# Patient Record
Sex: Male | Born: 1990 | Race: Black or African American | Hispanic: No | Marital: Married | Smoking: Current every day smoker
Health system: Southern US, Community
[De-identification: ages and names within clinical notes are randomized; demographics above are authoritative.]

---

## 2016-04-05 ENCOUNTER — Emergency Department (HOSPITAL_COMMUNITY): Payer: Worker's Compensation

## 2016-04-05 ENCOUNTER — Emergency Department (HOSPITAL_COMMUNITY)
Admission: EM | Admit: 2016-04-05 | Discharge: 2016-04-05 | Disposition: A | Discharge status: Home / Self Care | Payer: Worker's Compensation | Attending: Emergency Medicine | Admitting: Emergency Medicine

## 2016-04-05 ENCOUNTER — Encounter (HOSPITAL_COMMUNITY): Payer: Self-pay | Admitting: Emergency Medicine

## 2016-04-05 DIAGNOSIS — W11XXXA Fall on and from ladder, initial encounter: Secondary | ICD-10-CM | POA: Diagnosis not present

## 2016-04-05 DIAGNOSIS — W19XXXA Unspecified fall, initial encounter: Secondary | ICD-10-CM

## 2016-04-05 DIAGNOSIS — Y9389 Activity, other specified: Secondary | ICD-10-CM | POA: Diagnosis not present

## 2016-04-05 DIAGNOSIS — S92001B Unspecified fracture of right calcaneus, initial encounter for open fracture: Secondary | ICD-10-CM | POA: Diagnosis not present

## 2016-04-05 DIAGNOSIS — F17210 Nicotine dependence, cigarettes, uncomplicated: Secondary | ICD-10-CM | POA: Insufficient documentation

## 2016-04-05 DIAGNOSIS — Y929 Unspecified place or not applicable: Secondary | ICD-10-CM | POA: Insufficient documentation

## 2016-04-05 DIAGNOSIS — Y990 Civilian activity done for income or pay: Secondary | ICD-10-CM | POA: Diagnosis not present

## 2016-04-05 DIAGNOSIS — S92002B Unspecified fracture of left calcaneus, initial encounter for open fracture: Secondary | ICD-10-CM | POA: Insufficient documentation

## 2016-04-05 DIAGNOSIS — S92009B Unspecified fracture of unspecified calcaneus, initial encounter for open fracture: Secondary | ICD-10-CM

## 2016-04-05 DIAGNOSIS — S99921A Unspecified injury of right foot, initial encounter: Secondary | ICD-10-CM | POA: Diagnosis present

## 2016-04-05 MED ORDER — HYDROMORPHONE HCL 1 MG/ML IJ SOLN
0.5000 mg | Freq: Once | INTRAMUSCULAR | Status: AC
  Administered 2016-04-05: 0.5 mg via INTRAMUSCULAR
  Filled 2016-04-05: qty 1

## 2016-04-05 MED ORDER — HYDROMORPHONE HCL 1 MG/ML IJ SOLN
1.0000 mg | Freq: Once | INTRAMUSCULAR | Status: AC
  Administered 2016-04-05: 1 mg via INTRAVENOUS
  Filled 2016-04-05: qty 1

## 2016-04-05 MED ORDER — OXYCODONE-ACETAMINOPHEN 5-325 MG PO TABS: 1.0000 | ORAL_TABLET | Freq: Four times a day (QID) | ORAL | 0 refills | Status: DC | PRN

## 2016-04-05 MED ORDER — KETOROLAC TROMETHAMINE 30 MG/ML IJ SOLN
30.0000 mg | Freq: Once | INTRAMUSCULAR | Status: AC
  Administered 2016-04-05: 30 mg via INTRAMUSCULAR
  Filled 2016-04-05: qty 1

## 2016-04-05 NOTE — ED Triage Notes (Addendum)
Pt was at work and fell 20 feet from ladder and landed on bilateral heels, unable to walk at this time. Pt reports bottom of ladder slid on the asphalt causing him to fall.   Denies head trauma or LOC.  Pt alert and oriented at this time. Denies head/neck/back pain. Pt has pedal pulses bilaterally with appropriate sensation.  No deformity noted.

## 2016-04-05 NOTE — Discharge Instructions (Signed)
Use the pain medicine as needed for pain. Weight-bear as tolerated. The splints can come off for bathing. Follow-up with Dr. Romeo AppleHarrison.

## 2016-04-05 NOTE — ED Notes (Signed)
Patient requesting urine drug screen for his records just in case he needs it.  MD made aware and states that he has no reason to order it and it would now be positive since he has received pain meds in the ER.

## 2016-04-05 NOTE — ED Notes (Signed)
ED Provider at bedside. 

## 2016-04-05 NOTE — ED Provider Notes (Signed)
[facility]-EMERGENCY DEPT Provider Note   CSN: [id number] Arrival date & time: 04/05/16  1704     History   Chief Complaint Chief Complaint  Patient presents with  . Fall    HPI Travis Luna is a 26 y.o. male.  HPI Patient was working on a ladder about 20 feet up when he states the latter fell down and landed on both of his heels. States he cannot walk because of pain on the back of his foot. No other injury or pain to other parts of his body. No numbness or weakness. No headache. No numbness in his feet. Severe pain posteriorly on his feet.   History reviewed. No pertinent past medical history.  There are no active problems to display for this patient.   History reviewed. No pertinent surgical history.     Home Medications    Prior to Admission medications   Medication Sig Start Date End Date Taking? Authorizing Provider  oxyCODONE-acetaminophen (PERCOCET/ROXICET) 5-325 MG tablet Take 1-2 tablets by mouth every 6 (six) hours as needed for severe pain. 04/05/16   Benjiman CoreNathan Nimah Uphoff, MD    Family History History reviewed. No pertinent family history.  Social History Social History  Substance Use Topics  . Smoking status: Current Every Day Smoker    Packs/day: 0.50    Types: Cigarettes  . Smokeless tobacco: Not on file  . Alcohol use No     Allergies   Patient has no known allergies.   Review of Systems Review of Systems  Constitutional: Negative for appetite change.  HENT: Negative for dental problem.   Respiratory: Negative for shortness of breath.   Cardiovascular: Negative for chest pain.  Gastrointestinal: Negative for abdominal pain.  Genitourinary: Negative for flank pain.  Musculoskeletal: Positive for back pain and gait problem. Negative for neck pain and neck stiffness.       Bilateral heel pain.  Neurological: Negative for headaches.  Hematological: Negative for adenopathy.  Psychiatric/Behavioral: Negative for confusion.     Physical  Exam Updated Vital Signs BP 111/58 (BP Location: Left Arm)   Pulse 90   Temp 98.7 F (37.1 C) (Oral)   Resp 16   Ht 5\' 6"  (1.676 m)   Wt 140 lb (63.5 kg)   SpO2 99%   BMI 22.60 kg/m   Physical Exam  Constitutional: He appears well-developed.  HENT:  Head: Atraumatic.  Cardiovascular: Normal rate.   Pulmonary/Chest: Effort normal.  Abdominal: Soft.  Musculoskeletal: He exhibits tenderness.  Tenderness to bilateral heels. No tenderness over mid or anterior foot on either side. Mild tenderness over the very distal Achilles bilaterally but states the pain is more in his heel on the Achilles. Sensation intact grossly over both feet. No tenderness of her knees hips lower spine or other extremities.     ED Treatments / Results  Labs (all labs ordered are listed, but only abnormal results are displayed) Labs Reviewed - No data to display  EKG  EKG Interpretation None       Radiology Dg Os Calcis Left  Result Date: 04/05/2016 CLINICAL DATA:  Status post 20 foot fall from a ladder today. Bilateral heel pain. Initial encounter. EXAM: LEFT OS CALCIS - 2+ VIEW COMPARISON:  None. FINDINGS: A nondisplaced and incomplete fracture is seen through the posterior calcaneus. The subtalar joint and calcaneocuboid joint both appear normal. The examination is otherwise normal. IMPRESSION: Nondisplaced fracture through posterior calcaneus. Electronically Signed   By: Drusilla Kannerhomas  Dalessio M.D.   On: 04/05/2016 19:06  Dg Os Calcis Right  Result Date: 04/05/2016 CLINICAL DATA:  Status post 20 foot fall from a ladder today. Bilateral heel pain. Initial encounter. EXAM: RIGHT OS CALCIS - 2+ VIEW COMPARISON:  None. FINDINGS: Nondisplaced fracture the posterior aspect of the calcaneus is identified. The calcaneocuboid and subtalar joints appear normal. IMPRESSION: Nondisplaced fracture posterior calcaneus. Electronically Signed   By: Drusilla Kanner M.D.   On: 04/05/2016 19:08    Procedures Procedures  (including critical care time)  Medications Ordered in ED Medications  HYDROmorphone (DILAUDID) injection 0.5 mg (0.5 mg Intramuscular Given 04/05/16 1831)  HYDROmorphone (DILAUDID) injection 1 mg (1 mg Intravenous Given 04/05/16 2017)  ketorolac (TORADOL) 30 MG/ML injection 30 mg (30 mg Intramuscular Given 04/05/16 2017)     Initial Impression / Assessment and Plan / ED Course  I have reviewed the triage vital signs and the nursing notes.  Pertinent labs & imaging results that were available during my care of the patient were reviewed by me and considered in my medical decision making (see chart for details).  Clinical Course     Patient had a fall from a ladder about 20 feet up. Bilateral calcaneal fractures. Reviewed images with Dr. Romeo Apple with whom I discussed on the phone. Weight-bear as tolerated and bilateral cam walkers. Walker for home. Will get pain medicines. Will follow-up in the office. No sign of other injury, specifically no low back pain.  Final Clinical Impressions(s) / ED Diagnoses   Final diagnoses:  Fall, initial encounter  Open nondisplaced fracture of calcaneus, unspecified laterality, unspecified portion of calcaneus, initial encounter    New Prescriptions New Prescriptions   OXYCODONE-ACETAMINOPHEN (PERCOCET/ROXICET) 5-325 MG TABLET    Take 1-2 tablets by mouth every 6 (six) hours as needed for severe pain.     Benjiman Core, MD 04/05/16 [phone]

## 2016-04-05 NOTE — Progress Notes (Signed)
Patient ID: Travis Luna, male   DOB: Jun 30, 1990, 26 y.o.   MRN: [id number] ER phone call \\bilateral ND extrarticular fractures of both calcanei  rec cam walker both feet  Walker   wbat   Fu with me

## 2016-04-07 ENCOUNTER — Encounter: Payer: Self-pay | Admitting: Orthopedic Surgery

## 2016-04-07 ENCOUNTER — Ambulatory Visit (INDEPENDENT_AMBULATORY_CARE_PROVIDER_SITE_OTHER): Payer: Worker's Compensation | Admitting: Orthopedic Surgery

## 2016-04-07 ENCOUNTER — Telehealth: Payer: Self-pay | Admitting: Orthopedic Surgery

## 2016-04-07 VITALS — BP 119/75 | HR 70

## 2016-04-07 DIAGNOSIS — S92009A Unspecified fracture of unspecified calcaneus, initial encounter for closed fracture: Secondary | ICD-10-CM

## 2016-04-07 MED ORDER — OXYCODONE-ACETAMINOPHEN 5-325 MG PO TABS: 1.0000 | ORAL_TABLET | ORAL | 0 refills | Status: DC | PRN

## 2016-04-07 NOTE — Progress Notes (Signed)
Patient ID: Travis Luna, male   DOB: 10-Oct-1990, 26 y.o.   MRN: [id number]  Chief Complaint  Patient presents with  . Foot Injury    BILATERAL HEEL FRACTURES, DOI 04/05/16   [facility] [place] [facility]  HPI Travis Coaron Malkowski is a 26 y.o. male.  26 year old male was at work fell off of a ladder injured both calcanei but there are nondisplaced fractures  He was evaluated in the emergency room on January 10 presents with 2 day history of pain and swelling both heels difficulty walking. He says the pain is quite severe described as a dull ache and worse when he tries to put weight on his feet  Review of Systems Review of Systems 1. Negative for fever chills nausea diarrhea 2. Negative for chest pain shortness of breath  The patient reported no history of hypertension diabetes or coronary artery disease  He reported no prior surgeries   No past medical history on file.  No past surgical history on file.  Social History Social History  Substance Use Topics  . Smoking status: Current Every Day Smoker    Packs/day: 0.50    Types: Cigarettes  . Smokeless tobacco: Not on file  . Alcohol use No    No Known Allergies  Current Meds  Medication Sig  . oxyCODONE-acetaminophen (PERCOCET/ROXICET) 5-325 MG tablet Take 1-2 tablets by mouth every 6 (six) hours as needed for severe pain.      Physical Exam Physical Exam BP 119/75   Pulse 70   Gen. appearance. The patient is well-developed and well-nourished, grooming and hygiene are normal. There are no gross congenital abnormalities  The patient is alert and oriented to person place and time  Mood and affect are normal  Ambulation Wheelchair primarily with transfers and Cam Walker's  Examination reveals the following: On inspection we find tenderness in both heels minimal swelling no deformity  With the range of motion of  in the right and left ankle remain normal  Stability tests were normal   and right and left ankle  Strength tests revealed grade 5 motor strength in plantarflexion and dorsiflexion  Skin right and left heel mild bruising in the heel pad area  Sensation is intact and normal in both feet  He has no vascular related peripheral edema and good pulses in both fee   Data Reviewed Radiographs of both heels include Harris views he has normal intra-articular alignment without fracture but to fractures in the body of the calcaneus at the most proximal portion there nondisplaced  Assessment    Encounter Diagnosis  Name Primary?  . Closed nondisplaced fracture of calcaneus, unspecified laterality, unspecified portion of calcaneus, initial encounter Yes       Plan    Weightbearing as tolerated in Cam Walker's  Follow-up for reevaluation no work for the next 6-8 weeks     Encounter Diagnosis  Name Primary?  . Closed nondisplaced fracture of calcaneus, Bilateral unspecified portion of calcaneus, initial encounter Yes    Meds ordered this encounter  Medications  . oxyCODONE-acetaminophen (PERCOCET/ROXICET) 5-325 MG tablet    Sig: Take 1 tablet by mouth every 4 (four) hours as needed for severe pain.    Dispense:  42 tablet    Refill:  0     Fuller CanadaStanley Juvia Aerts 04/07/2016, 9:05 AM

## 2016-04-07 NOTE — Patient Instructions (Signed)
OOW 6 WEEKS  

## 2016-04-25 NOTE — Telephone Encounter (Signed)
Patient called about the appointment scheduled for 04/28/16 with Dr Romeo AppleHarrison. He states has had a transfer of care to another orthopaedist per his Workers [employer] planning/management officercomp insurer; therefore, said the follow up appointment for 04/28/16 is to be cancelled.

## 2016-04-28 ENCOUNTER — Ambulatory Visit: Payer: Self-pay | Admitting: Orthopedic Surgery

## 2017-07-17 ENCOUNTER — Emergency Department (HOSPITAL_COMMUNITY)
Admission: EM | Admit: 2017-07-17 | Discharge: 2017-07-18 | Disposition: A | Discharge status: Home / Self Care | Payer: Self-pay | Attending: Emergency Medicine | Admitting: Emergency Medicine

## 2017-07-17 ENCOUNTER — Encounter (HOSPITAL_COMMUNITY): Payer: Self-pay | Admitting: *Deleted

## 2017-07-17 ENCOUNTER — Other Ambulatory Visit: Payer: Self-pay

## 2017-07-17 DIAGNOSIS — F17290 Nicotine dependence, other tobacco product, uncomplicated: Secondary | ICD-10-CM | POA: Insufficient documentation

## 2017-07-17 DIAGNOSIS — R310 Gross hematuria: Secondary | ICD-10-CM | POA: Insufficient documentation

## 2017-07-17 LAB — URINALYSIS, ROUTINE W REFLEX MICROSCOPIC
BILIRUBIN URINE: NEGATIVE
Bacteria, UA: NONE SEEN
Glucose, UA: NEGATIVE mg/dL
Ketones, ur: NEGATIVE mg/dL
LEUKOCYTES UA: NEGATIVE
NITRITE: NEGATIVE
PROTEIN: NEGATIVE mg/dL
RBC / HPF: >50 RBC/hpf — ABNORMAL HIGH (ref 0–5)
Specific Gravity, Urine: 1.024 (ref 1.005–1.030)
pH: 7 (ref 5.0–8.0)

## 2017-07-17 MED ORDER — DOXYCYCLINE HYCLATE 100 MG PO TABS
100.0000 mg | ORAL_TABLET | Freq: Once | ORAL | Status: AC
  Administered 2017-07-18: 100 mg via ORAL
  Filled 2017-07-17: qty 1

## 2017-07-17 NOTE — ED Triage Notes (Addendum)
Pt reports having 2 episodes of bright blood in his urine earlier. Pt denies any other urinary symptoms or abdominal pain.   2200 pt is now c/o dysuria

## 2017-07-17 NOTE — ED Provider Notes (Signed)
[facility]ENC[facility]PART[facility] Provider Note   CSN: [id number] Arrival date & time: 07/17/17  2111  Time seen 23:40 PM   History   Chief Complaint Chief Complaint  Patient presents with  . Hematuria    HPI Travis Luna is a 27 y.o. male.  HPI patient states about 3:40 PM today after he got off work at [employer] he went to use the bathroom and saw blood in his urine.  He states when he went back again at 430 he had another episode but it was not as dark.  He states he did not have any pain with urination.  He states when he got to the ED here there were some drops of bright red blood in the toilet and he shows me a picture of it.  He denies frequency, abdominal pain, flank pain, or fever.  He has not had nausea, vomiting, and he denies any trauma.  He states he is never had this before.  He denies any new sexual partners however both his parents are in the room.  PCP Patient, No Pcp Per   History reviewed. No pertinent past medical history.  There are no active problems to display for this patient.   History reviewed. No pertinent surgical history.      Home Medications    Prior to Admission medications   Medication Sig Start Date End Date Taking? Authorizing Provider  doxycycline (VIBRAMYCIN) 100 MG capsule Take 1 capsule (100 mg total) by mouth 2 (two) times daily. 07/18/17   Devoria AlbeKnapp, Makaylie Dedeaux, MD    Family History Family History  Problem Relation Age of Onset  . Hypertension Mother   . Hypertension Father   . Scoliosis Father     Social History Social History   Tobacco Use  . Smoking status: Current Every Day Smoker    Types: Cigars  . Smokeless tobacco: Former NeurosurgeonUser  . Tobacco comment: 1/2 a black and mild  Substance Use Topics  . Alcohol use: No  . Drug use: No  smokes 1 black and mild daily Works at [employer] Married   Allergies   Patient has no known allergies.   Review of Systems Review of Systems  All other systems reviewed and are  negative.    Physical Exam Updated Vital Signs BP (!) 141/74 (BP Location: Right Arm)   Pulse 64   Temp 98.6 F (37 C) (Oral)   Resp 18   Ht 5\' 6"  (1.676 m)   Wt 63.5 kg (140 lb)   SpO2 100%   BMI 22.60 kg/m   Vital signs normal    Physical Exam  Constitutional: He appears well-developed and well-nourished.  HENT:  Head: Normocephalic and atraumatic.  Right Ear: External ear normal.  Left Ear: External ear normal.  Nose: Nose normal.  Eyes: Conjunctivae and EOM are normal.  Neck: Normal range of motion.  Cardiovascular: Normal rate.  Pulmonary/Chest: Effort normal. No respiratory distress.  Abdominal: Soft. He exhibits no distension and no mass. There is no tenderness. There is no rebound and no guarding.  Genitourinary:  Genitourinary Comments: No CVAT bilaterally  Nursing note reviewed.    ED Treatments / Results  Labs (all labs ordered are listed, but only abnormal results are displayed)  Results for orders placed or performed during the hospital encounter of 07/17/17  Urinalysis, Routine w reflex microscopic  Result Value Ref Range   Color, Urine YELLOW YELLOW   APPearance HAZY (A) CLEAR   Specific Gravity, Urine 1.024 1.005 - 1.030  pH 7.0 5.0 - 8.0   Glucose, UA NEGATIVE NEGATIVE mg/dL   Hgb urine dipstick MODERATE (A) NEGATIVE   Bilirubin Urine NEGATIVE NEGATIVE   Ketones, ur NEGATIVE NEGATIVE mg/dL   Protein, ur NEGATIVE NEGATIVE mg/dL   Nitrite NEGATIVE NEGATIVE   Leukocytes, UA NEGATIVE NEGATIVE   RBC / HPF >50 (H) 0 - 5 RBC/hpf   WBC, UA 0-5 0 - 5 WBC/hpf   Bacteria, UA NONE SEEN NONE SEEN   Squamous Epithelial / LPF 0-5 0 - 5   Mucus PRESENT    Laboratory interpretation all normal except hematuria   EKG None  Radiology No results found.  Procedures Procedures (including critical care time)  Medications Ordered in ED Medications  doxycycline (VIBRA-TABS) tablet 100 mg (has no administration in time range)     Initial  Impression / Assessment and Plan / ED Course  I have reviewed the triage vital signs and the nursing notes.  Pertinent labs & imaging results that were available during my care of the patient were reviewed by me and considered in my medical decision making (see chart for details).    GC/Chlamydia testing was added to his urinalysis.  As discussed with the patient without abdominal pain or flank pain the most likely diagnosis is going to be a urinary tract infection although he did not have any white blood cells in his urine.  He was started on doxycycline due to his age.  He was referred to urology.  We discussed there is no need to come back to the ED for persistent blood in his urine unless he has a fever, vomiting, abdominal or flank pain, or if he is unable to urinate.   Final Clinical Impressions(s) / ED Diagnoses   Final diagnoses:  Gross hematuria    ED Discharge Orders        Ordered    doxycycline (VIBRAMYCIN) 100 MG capsule  2 times daily     07/18/17 0001     Plan discharge  Devoria Albe, MD, Concha Pyo, MD 07/18/17 0002

## 2017-07-18 MED ORDER — DOXYCYCLINE HYCLATE 100 MG PO CAPS: 100.0000 mg | ORAL_CAPSULE | Freq: Two times a day (BID) | ORAL | 0 refills | Status: AC

## 2017-07-18 NOTE — Discharge Instructions (Signed)
Drink plenty of fluids to keep your urine flowing so you don't get blood clots that could block your urethra and then you couldn't urinate. Take the antibiotics until gone. Return to the ED if you get a fever, vomiting, get pain in your abdomen or flanks or if you are unable to urinate. Otherwise call [facility] to have them evaluate the blood in your urine.

## 2017-09-30 ENCOUNTER — Emergency Department (HOSPITAL_COMMUNITY)
Admission: EM | Admit: 2017-09-30 | Discharge: 2017-09-30 | Disposition: A | Discharge status: Home / Self Care | Payer: Self-pay | Attending: Emergency Medicine | Admitting: Emergency Medicine

## 2017-09-30 ENCOUNTER — Other Ambulatory Visit: Payer: Self-pay

## 2017-09-30 ENCOUNTER — Encounter (HOSPITAL_COMMUNITY): Payer: Self-pay | Admitting: Emergency Medicine

## 2017-09-30 DIAGNOSIS — Z79899 Other long term (current) drug therapy: Secondary | ICD-10-CM | POA: Insufficient documentation

## 2017-09-30 DIAGNOSIS — Y93H2 Activity, gardening and landscaping: Secondary | ICD-10-CM | POA: Insufficient documentation

## 2017-09-30 DIAGNOSIS — W57XXXA Bitten or stung by nonvenomous insect and other nonvenomous arthropods, initial encounter: Secondary | ICD-10-CM | POA: Insufficient documentation

## 2017-09-30 DIAGNOSIS — S90861A Insect bite (nonvenomous), right foot, initial encounter: Secondary | ICD-10-CM | POA: Insufficient documentation

## 2017-09-30 DIAGNOSIS — Y92007 Garden or yard of unspecified non-institutional (private) residence as the place of occurrence of the external cause: Secondary | ICD-10-CM | POA: Insufficient documentation

## 2017-09-30 DIAGNOSIS — T63441A Toxic effect of venom of bees, accidental (unintentional), initial encounter: Secondary | ICD-10-CM

## 2017-09-30 DIAGNOSIS — Y999 Unspecified external cause status: Secondary | ICD-10-CM | POA: Insufficient documentation

## 2017-09-30 DIAGNOSIS — F17210 Nicotine dependence, cigarettes, uncomplicated: Secondary | ICD-10-CM | POA: Insufficient documentation

## 2017-09-30 MED ORDER — DIPHENHYDRAMINE HCL 25 MG PO TABS: 25.0000 mg | ORAL_TABLET | Freq: Four times a day (QID) | ORAL | 0 refills | Status: AC

## 2017-09-30 MED ORDER — DIPHENHYDRAMINE HCL 25 MG PO CAPS
25.0000 mg | ORAL_CAPSULE | Freq: Once | ORAL | Status: AC
  Administered 2017-09-30: 25 mg via ORAL
  Filled 2017-09-30: qty 1

## 2017-09-30 MED ORDER — RANITIDINE HCL 150 MG PO TABS: 150.0000 mg | ORAL_TABLET | Freq: Two times a day (BID) | ORAL | 0 refills | Status: AC

## 2017-09-30 MED ORDER — PREDNISONE 10 MG PO TABS: 40.0000 mg | ORAL_TABLET | Freq: Every day | ORAL | 0 refills | Status: AC

## 2017-09-30 MED ORDER — FAMOTIDINE 20 MG PO TABS
20.0000 mg | ORAL_TABLET | Freq: Once | ORAL | Status: AC
  Administered 2017-09-30: 20 mg via ORAL
  Filled 2017-09-30: qty 1

## 2017-09-30 NOTE — Discharge Instructions (Signed)
Return for any problems

## 2017-09-30 NOTE — ED Provider Notes (Signed)
[facility] EMERGENCY DEPARTMENT Provider Note   CSN: [id number] Arrival date & time: 09/30/17  1252     History   Chief Complaint Chief Complaint  Patient presents with  . Insect Bite    HPI Travis Luna is a 27 y.o. male who presents to the ED for a bee sting. Patient reports he was mowing his grass yesterday when he got stung and today his right foot is swollen and tender. Patient has not taken any medication for the sting. Patient denies shortness of breath or difficulty swallowing  HPI  History reviewed. No pertinent past medical history.  There are no active problems to display for this patient.   History reviewed. No pertinent surgical history.      Home Medications    Prior to Admission medications   Medication Sig Start Date End Date Taking? Authorizing Provider  diphenhydrAMINE (BENADRYL) 25 MG tablet Take 1 tablet (25 mg total) by mouth every 6 (six) hours. 09/30/17   Janne Napoleon, NP  doxycycline (VIBRAMYCIN) 100 MG capsule Take 1 capsule (100 mg total) by mouth 2 (two) times daily. 07/18/17   Devoria Albe, MD  predniSONE (DELTASONE) 10 MG tablet Take 4 tablets (40 mg total) by mouth daily with breakfast. 09/30/17   Janne Napoleon, NP  ranitidine (ZANTAC) 150 MG tablet Take 1 tablet (150 mg total) by mouth 2 (two) times daily. 09/30/17   Janne Napoleon, NP    Family History Family History  Problem Relation Age of Onset  . Hypertension Mother   . Hypertension Father   . Scoliosis Father     Social History Social History   Tobacco Use  . Smoking status: Current Every Day Smoker    Types: Cigars  . Smokeless tobacco: Former Neurosurgeon  . Tobacco comment: 1/2 a black and mild  Substance Use Topics  . Alcohol use: No  . Drug use: No     Allergies   Patient has no known allergies.   Review of Systems Review of Systems  Musculoskeletal: Positive for arthralgias.  Skin: Positive for color change and wound.  All other systems reviewed and are  negative.    Physical Exam Updated Vital Signs BP 122/67 (BP Location: Right Arm)   Pulse 100   Temp 98.5 F (36.9 C) (Oral)   Resp 18   Ht 5\' 6"  (1.676 m)   Wt 53.5 kg (118 lb)   SpO2 100%   BMI 19.05 kg/m   Physical Exam  Constitutional: He appears well-developed and well-nourished. No distress.  HENT:  Mouth/Throat: Oropharynx is clear and moist.  Eyes: Conjunctivae and EOM are normal.  Neck: Normal range of motion. Neck supple.  Cardiovascular: Normal rate.  Pulmonary/Chest: Effort normal. No respiratory distress.  Musculoskeletal:       Right ankle: He exhibits swelling. He exhibits normal pulse. Tenderness.  Areas of bee stings noted to the lateral aspect of the right ankle. There is swelling and erythema of the ankle extending to the right foot. Appears as local reaction.  Neurological: He is alert.  Skin: There is erythema (right ankle/foot).  Swelling of right foot and ankle.   Psychiatric: He has a normal mood and affect.  Nursing note and vitals reviewed.    ED Treatments / Results  Labs (all labs ordered are listed, but only abnormal results are displayed) Labs Reviewed - No data to display Radiology No results found.  Procedures Procedures (including critical care time)  Medications Ordered in ED Medications  diphenhydrAMINE (  BENADRYL) capsule 25 mg (25 mg Oral Given 09/30/17 1308)  famotidine (PEPCID) tablet 20 mg (20 mg Oral Given 09/30/17 1308)     Initial Impression / Assessment and Plan / ED Course  I have reviewed the triage vital signs and the nursing notes. 27 y.o. male here with swelling, itching and burning to the right ankle and foot s/p bee stings to the left ankle yesterday. Patient stable for d/c without respiratory symptoms and no swelling of the throat. No signs of infection. Will treat for local reaction to bee sting. Discussed with the patient signs and symptoms that would be indication for return to the ED. Patient agrees with plan.    Final Clinical Impressions(s) / ED Diagnoses   Final diagnoses:  Local reaction to bee sting, accidental or unintentional, initial encounter    ED Discharge Orders        Ordered    diphenhydrAMINE (BENADRYL) 25 MG tablet  Every 6 hours     09/30/17 1422    predniSONE (DELTASONE) 10 MG tablet  Daily with breakfast     09/30/17 1422    ranitidine (ZANTAC) 150 MG tablet  2 times daily     09/30/17 1422       Damian Leavelleese, [place] M, NP 09/30/17 1426    Linwood DibblesKnapp, Jon, MD 09/30/17 1549

## 2017-09-30 NOTE — ED Triage Notes (Signed)
Stung by bee yesterday, now RT foot is swollen and painful

## 2017-12-23 IMAGING — DX DG OS CALCIS 2+V*L*
2 series · 2 of 2 positions shown · non-contrast
Comparison: None.

CLINICAL DATA: Status post 20 foot fall from a ladder today.
Bilateral heel pain. Initial encounter.

EXAM:
LEFT OS CALCIS - 2+ VIEW

[calcaneus axial]
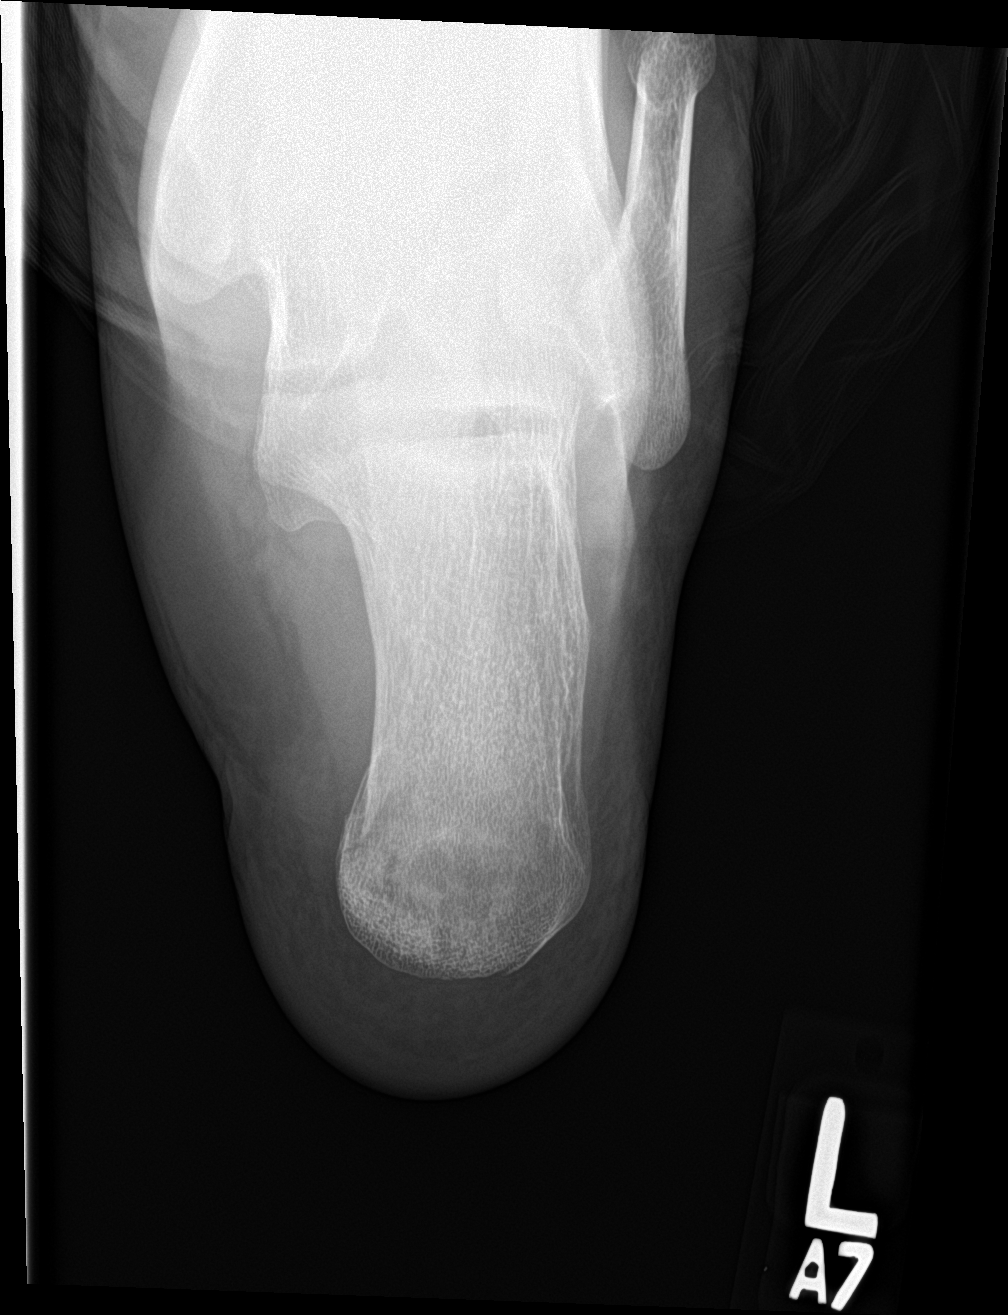

[calcaneus lat]
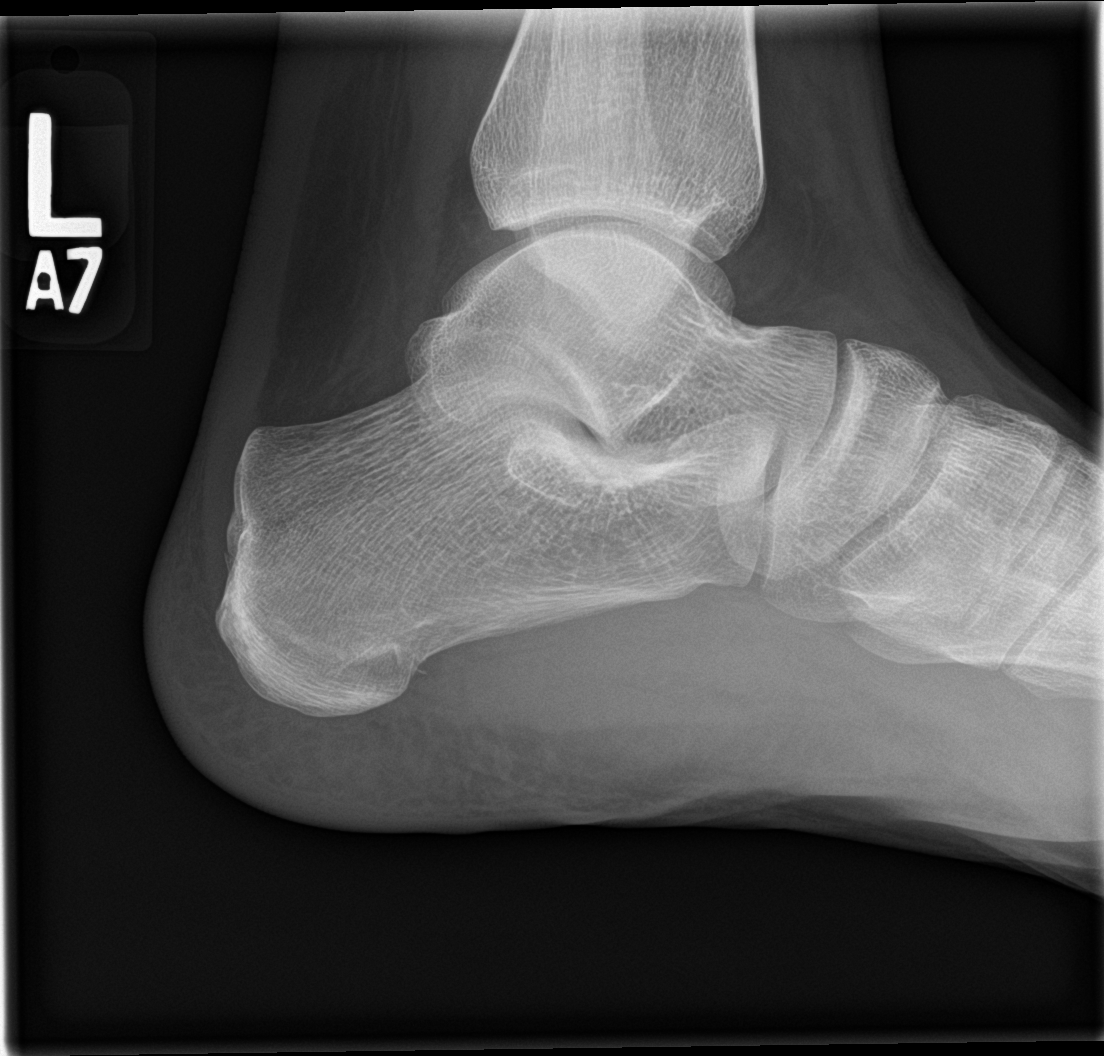

[2 of 2 positions shown; findings below may reference images not displayed]

FINDINGS: A nondisplaced and incomplete fracture is seen through the posterior
calcaneus. The subtalar joint and calcaneocuboid joint both appear
normal. The examination is otherwise normal.
IMPRESSION: Nondisplaced fracture through posterior calcaneus.
